# Patient Record
Sex: Female | Born: 1965 | Race: Black or African American | Hispanic: No | Marital: Single | Smoking: Never smoker
Health system: Southern US, Community
[De-identification: ages and names within clinical notes are randomized; demographics above are authoritative.]

## PROBLEM LIST (undated history)

## (undated) DIAGNOSIS — I10 Essential (primary) hypertension: Secondary | ICD-10-CM

## (undated) DIAGNOSIS — I8390 Asymptomatic varicose veins of unspecified lower extremity: Secondary | ICD-10-CM

## (undated) DIAGNOSIS — M9261 Juvenile osteochondrosis of tarsus, right ankle: Secondary | ICD-10-CM

## (undated) DIAGNOSIS — E119 Type 2 diabetes mellitus without complications: Secondary | ICD-10-CM

## (undated) HISTORY — DX: Juvenile osteochondrosis of tarsus, right ankle: M92.61

## (undated) HISTORY — DX: Type 2 diabetes mellitus without complications: E11.9

## (undated) HISTORY — DX: Asymptomatic varicose veins of unspecified lower extremity: I83.90

## (undated) HISTORY — DX: Essential (primary) hypertension: I10

---

## 1998-05-30 ENCOUNTER — Emergency Department (HOSPITAL_COMMUNITY): Admission: EM | Admit: 1998-05-30 | Discharge: 1998-05-30 | Payer: Self-pay | Admitting: Emergency Medicine

## 2003-06-07 ENCOUNTER — Other Ambulatory Visit: Admission: RE | Admit: 2003-06-07 | Discharge: 2003-06-07 | Payer: Self-pay | Admitting: Internal Medicine

## 2004-05-26 ENCOUNTER — Emergency Department (HOSPITAL_COMMUNITY): Admission: EM | Admit: 2004-05-26 | Discharge: 2004-05-27 | Payer: Self-pay | Admitting: Family Medicine

## 2004-05-29 ENCOUNTER — Ambulatory Visit: Payer: Self-pay | Admitting: Internal Medicine

## 2004-06-04 ENCOUNTER — Encounter: Admission: RE | Admit: 2004-06-04 | Discharge: 2004-06-04 | Payer: Self-pay | Admitting: Internal Medicine

## 2004-06-07 ENCOUNTER — Ambulatory Visit: Payer: Self-pay | Admitting: Internal Medicine

## 2004-06-07 ENCOUNTER — Other Ambulatory Visit: Admission: RE | Admit: 2004-06-07 | Discharge: 2004-06-07 | Payer: Self-pay | Admitting: Internal Medicine

## 2004-06-21 ENCOUNTER — Emergency Department (HOSPITAL_COMMUNITY): Admission: EM | Admit: 2004-06-21 | Discharge: 2004-06-21 | Payer: Self-pay | Admitting: Emergency Medicine

## 2004-07-14 ENCOUNTER — Emergency Department (HOSPITAL_COMMUNITY): Admission: EM | Admit: 2004-07-14 | Discharge: 2004-07-14 | Payer: Self-pay | Admitting: Emergency Medicine

## 2004-07-19 ENCOUNTER — Ambulatory Visit: Payer: Self-pay | Admitting: Internal Medicine

## 2004-07-20 ENCOUNTER — Ambulatory Visit (HOSPITAL_COMMUNITY): Admission: RE | Admit: 2004-07-20 | Discharge: 2004-07-20 | Payer: Self-pay | Admitting: Internal Medicine

## 2004-08-02 ENCOUNTER — Ambulatory Visit: Payer: Self-pay | Admitting: Gastroenterology

## 2004-08-06 ENCOUNTER — Ambulatory Visit: Payer: Self-pay | Admitting: Gastroenterology

## 2004-08-24 ENCOUNTER — Ambulatory Visit: Payer: Self-pay | Admitting: Internal Medicine

## 2004-10-09 ENCOUNTER — Ambulatory Visit: Payer: Self-pay | Admitting: Internal Medicine

## 2005-08-27 ENCOUNTER — Ambulatory Visit: Payer: Self-pay | Admitting: Internal Medicine

## 2005-09-02 ENCOUNTER — Ambulatory Visit: Payer: Self-pay | Admitting: Internal Medicine

## 2006-06-16 ENCOUNTER — Ambulatory Visit: Payer: Self-pay | Admitting: Internal Medicine

## 2006-06-20 ENCOUNTER — Ambulatory Visit: Payer: Self-pay | Admitting: Internal Medicine

## 2006-06-20 LAB — CONVERTED CEMR LAB: Hepatitis B-Post: >1000 milliintl units/mL

## 2006-11-01 ENCOUNTER — Emergency Department (HOSPITAL_COMMUNITY): Admission: EM | Admit: 2006-11-01 | Discharge: 2006-11-01 | Payer: Self-pay | Admitting: Emergency Medicine

## 2006-12-02 ENCOUNTER — Telehealth: Payer: Self-pay | Admitting: Internal Medicine

## 2007-05-06 ENCOUNTER — Ambulatory Visit: Payer: Self-pay | Admitting: *Deleted

## 2007-05-21 ENCOUNTER — Encounter (INDEPENDENT_AMBULATORY_CARE_PROVIDER_SITE_OTHER): Payer: Self-pay | Admitting: Nurse Practitioner

## 2007-05-21 ENCOUNTER — Ambulatory Visit: Payer: Self-pay | Admitting: Family Medicine

## 2007-05-21 LAB — CONVERTED CEMR LAB
AST: 18 units/L (ref 0–37)
Alkaline Phosphatase: 90 units/L (ref 39–117)
CO2: 29 meq/L (ref 19–32)
Eosinophils Relative: 1 % (ref 0–5)
Glucose, Bld: 89 mg/dL (ref 70–99)
HDL: 58 mg/dL (ref >39)
Hemoglobin: 12 g/dL (ref 12.0–15.0)
Lymphs Abs: 3.2 10*3/uL (ref 0.7–4.0)
MCV: 69.3 fL — ABNORMAL LOW (ref 78.0–100.0)
Monocytes Absolute: 0.4 10*3/uL (ref 0.1–1.0)
Neutro Abs: 3.2 10*3/uL (ref 1.7–7.7)
Neutrophils Relative %: 47 % (ref 43–77)
Platelets: 354 10*3/uL (ref 150–400)
RDW: 16.7 % — ABNORMAL HIGH (ref 11.5–15.5)
TSH: 1.727 microintl units/mL (ref 0.350–5.50)
Total CHOL/HDL Ratio: 3.1

## 2007-09-23 ENCOUNTER — Telehealth: Payer: Self-pay | Admitting: *Deleted

## 2007-10-12 ENCOUNTER — Ambulatory Visit: Payer: Self-pay | Admitting: Internal Medicine

## 2007-10-12 DIAGNOSIS — K219 Gastro-esophageal reflux disease without esophagitis: Secondary | ICD-10-CM

## 2007-10-12 DIAGNOSIS — I10 Essential (primary) hypertension: Secondary | ICD-10-CM | POA: Insufficient documentation

## 2007-10-12 DIAGNOSIS — D649 Anemia, unspecified: Secondary | ICD-10-CM

## 2007-10-12 DIAGNOSIS — M7660 Achilles tendinitis, unspecified leg: Secondary | ICD-10-CM

## 2007-11-03 ENCOUNTER — Ambulatory Visit: Payer: Self-pay | Admitting: Internal Medicine

## 2007-11-03 LAB — CONVERTED CEMR LAB
ALT: 15 units/L (ref 0–35)
AST: 21 units/L (ref 0–37)
Albumin: 3.1 g/dL — ABNORMAL LOW (ref 3.5–5.2)
Basophils Absolute: 0 10*3/uL (ref 0.0–0.1)
Basophils Relative: 0.6 % (ref 0.0–3.0)
Bilirubin, Direct: 0.1 mg/dL (ref 0.0–0.3)
Chloride: 93 meq/L — ABNORMAL LOW (ref 96–112)
Cholesterol: 159 mg/dL (ref 0–200)
Eosinophils Absolute: 0.1 10*3/uL (ref 0.0–0.7)
GFR calc Af Amer: 142 mL/min
Hemoglobin: 11.4 g/dL — ABNORMAL LOW (ref 12.0–15.0)
Ketones, urine, test strip: NEGATIVE
LDL Cholesterol: 105 mg/dL — ABNORMAL HIGH (ref 0–99)
Lymphocytes Relative: 42.7 % (ref 12.0–46.0)
Neutrophils Relative %: 47.7 % (ref 43.0–77.0)
Platelets: 345 10*3/uL (ref 150–400)
Potassium: 3 meq/L — ABNORMAL LOW (ref 3.5–5.1)
Specific Gravity, Urine: 1.02
TSH: 2.62 microintl units/mL (ref 0.35–5.50)
Total CHOL/HDL Ratio: 3.6
Urobilinogen, UA: 0.2
pH: 6

## 2007-11-10 ENCOUNTER — Encounter: Payer: Self-pay | Admitting: Internal Medicine

## 2007-11-10 ENCOUNTER — Ambulatory Visit: Payer: Self-pay | Admitting: Internal Medicine

## 2007-11-10 ENCOUNTER — Other Ambulatory Visit: Admission: RE | Admit: 2007-11-10 | Discharge: 2007-11-10 | Payer: Self-pay | Admitting: Internal Medicine

## 2007-11-10 DIAGNOSIS — E876 Hypokalemia: Secondary | ICD-10-CM

## 2007-11-12 ENCOUNTER — Telehealth: Payer: Self-pay | Admitting: Internal Medicine

## 2009-05-06 ENCOUNTER — Emergency Department (HOSPITAL_COMMUNITY): Admission: EM | Admit: 2009-05-06 | Discharge: 2009-05-06 | Payer: Self-pay | Admitting: Emergency Medicine

## 2009-05-19 ENCOUNTER — Ambulatory Visit: Payer: Self-pay | Admitting: Internal Medicine

## 2009-05-19 DIAGNOSIS — N83209 Unspecified ovarian cyst, unspecified side: Secondary | ICD-10-CM | POA: Insufficient documentation

## 2009-05-19 DIAGNOSIS — R109 Unspecified abdominal pain: Secondary | ICD-10-CM | POA: Insufficient documentation

## 2009-05-19 DIAGNOSIS — R030 Elevated blood-pressure reading, without diagnosis of hypertension: Secondary | ICD-10-CM | POA: Insufficient documentation

## 2009-06-09 ENCOUNTER — Ambulatory Visit: Payer: Self-pay | Admitting: Internal Medicine

## 2009-06-09 LAB — CONVERTED CEMR LAB
Creatinine, Ser: 0.7 mg/dL (ref 0.4–1.2)
Eosinophils Absolute: 0.2 10*3/uL (ref 0.0–0.7)
Glucose, Bld: 100 mg/dL — ABNORMAL HIGH (ref 70–99)
HCT: 33.9 % — ABNORMAL LOW (ref 36.0–46.0)
Lymphocytes Relative: 33.6 % (ref 12.0–46.0)
Lymphs Abs: 3 10*3/uL (ref 0.7–4.0)
MCHC: 31.6 g/dL (ref 30.0–36.0)
Monocytes Absolute: 0.6 10*3/uL (ref 0.1–1.0)
Neutro Abs: 5.1 10*3/uL (ref 1.4–7.7)
Neutrophils Relative %: 57.7 % (ref 43.0–77.0)
Platelets: 305 10*3/uL (ref 150.0–400.0)
Potassium: 4.3 meq/L (ref 3.5–5.1)
RDW: 16.1 % — ABNORMAL HIGH (ref 11.5–14.6)

## 2009-06-16 ENCOUNTER — Ambulatory Visit: Payer: Self-pay | Admitting: Internal Medicine

## 2009-06-16 DIAGNOSIS — F4321 Adjustment disorder with depressed mood: Secondary | ICD-10-CM | POA: Insufficient documentation

## 2009-06-16 DIAGNOSIS — N920 Excessive and frequent menstruation with regular cycle: Secondary | ICD-10-CM

## 2009-07-14 ENCOUNTER — Ambulatory Visit: Payer: Self-pay | Admitting: Internal Medicine

## 2009-07-14 DIAGNOSIS — M79609 Pain in unspecified limb: Secondary | ICD-10-CM

## 2010-05-15 NOTE — Assessment & Plan Note (Signed)
Summary: EVAL OF ABD PAIN (OVARIAN CYST CONCERNS?) // RS   Vital Signs:  Patient profile:   45 year old female Menstrual status:  regular LMP:     05/04/2009 Height:      67 inches Weight:      289 pounds BMI:     45.43 Temp:     98.6 degrees F oral Pulse rate:   72 / minute BP sitting:   130 / 70  (right arm) Cuff size:   large  Vitals Entered By: Romualdo Bolk, CMA (AAMA) (May 19, 2009 4:09 PM) CC: Left Ovarian Cyst that burst and was seen at [facility] ED on 1/22. Pt is still having severe abd. pain for this. Pt was given hydrocodone and motrin for this. But didn't help the pain. LMP (date): 05/04/2009     Menstrual Status regular Enter LMP: 05/04/2009   History of Present Illness: Ashley Reynolds comesin for  acute appt  today for above.     Had moved to [place] and now back in the area recently and comes back to  reestablish with  the practice.   Last OV here was 7 2009 before she moved.  Over the last  1-2 years she has been generally well and on her HT meds  but rand out of all meds since  NOV.  Was  seen in ED    May 06 2009  when she awoke for sleep because of sudden onset of  very severe pain   left side radiating ot lower abdomen Pain was 10/10 and .   had vomiting.  No fever.   Thought it was a pulled muscle but no injury and  didnt let up and hard to get comfortable so went to ed.    Dx ?  ovarian cyst.     ruptured and rx with vicodin  and  ibuprofen.  had an essentially neg ct of abd pelvis and labs except  potassium was 3. 1  hand hg was in the 10 range . Since that time the pain persists although not a 10/10 . uncomfortable  . sharp pains left mid back and around to lower abdomen  not on right .   no rash  weakness or leg signs .   Hurts certain ways  and after  standing a long period of time.     No fever wieght loss GI or GU dysfunction or association.     Preventive Screening-Counseling & Management  Alcohol-Tobacco     Alcohol drinks/day: 0  Smoking Status: never  Caffeine-Diet-Exercise     Caffeine use/day: 2      Does Patient Exercise: no  Current Medications (verified): 1)  Enalapril-Hydrochlorothiazide 10-25 Mg Tabs (Enalapril-Hydrochlorothiazide) .Marland Kitchen.. 1 By Mouth Once Daily 2)  Zantac 150 Mg  Tabs (Ranitidine Hcl) .Marland Kitchen.. 1 By Mouth Two Times A Day or As Directed  Allergies (verified): No Known Drug Allergies  Past History:  Past Medical History: GERD Hypertension hx of low albumin  hx h pylori rx x 2   urine clear [place] cyst r kidney       Past Surgical History: Denies surgical history    Past History:  Care Management: None Current  Family History: Family History Diabetes 1st degree relative Family History High cholesterol Family History Hypertension Sickle cell disease neice  GF Kidney disease  Family History of Arthritis   Social History: no tobacco  no ets  no pets  hh of 1  sleep  Not much    Single  helping with sick family members    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  Smoking Status:  never Caffeine use/day:  2  Does Patient Exercise:  no  Review of Systems  The patient denies anorexia, fever, vision loss, chest pain, syncope, dyspnea on exertion, peripheral edema, prolonged cough, headaches, hemoptysis, abdominal pain, melena, hematochezia, severe indigestion/heartburn, hematuria, muscle weakness, transient blindness, difficulty walking, abnormal bleeding, enlarged lymph nodes, and angioedema.    Physical Exam  General:  Well-developed,well-nourished,in no mild discomfort  alert,appropriate and cooperative throughout examination Head:  Normocephalic and atraumatic without obvious abnormalities. No apparent alopecia or balding. Eyes:  vision grossly intact, pupils equal, and pupils round.   Ears:  R ear normal and L ear normal.   Neck:  No deformities, masses, or tenderness noted. Lungs:  Normal respiratory effort,  chest expands symmetrically. Lungs are clear to auscultation, no crackles or wheezes.no dullness.   Heart:  Normal rate and regular rhythm. S1 and S2 normal without gallop, murmur, click, rub or other extra sounds. Abdomen:  non-tender, normal bowel sounds, no distention, no masses, no guarding, no rebound tenderness, no abdominal hernia, no inguinal hernia, no hepatomegaly, and no splenomegaly.   left high lumbar  and radiatino around to lateral abdomen tender  . No rash and no masses .  Msk:  no joint swelling and no joint warmth.   Pulses:  no clubbing cyanosis or edema  Extremities:  pulses intact without delay   Neurologic:  gait nl no weakness   neg slr  Skin:  turgor normal, color normal, no ecchymoses, and no petechiae.   Cervical Nodes:  No lymphadenopathy noted Psych:  Oriented X3, good eye contact, not anxious appearing, and not depressed appearing.     Impression & Recommendations:  Problem # 1:  FLANK PAIN, LEFT (ICD-789.09)  dx as mittlesmertx in ed  but all scans and labs unrevealing.   continued pain is atypical for this . location of pain seems renal or MS cause but no cause obvious ..  poss MS and has to bend a lot in job.    consider short course prednisone   incase pain is MS neuritis based.   Her updated medication list for this problem includes:    Ibuprofen 800 Mg Tabs (Ibuprofen) .Marland Kitchen... 1 by mouth q8 hoiurs as needed pain  Orders: Prescription Created Electronically [phone])  Problem # 2:  ABDOMINAL PAIN, LOWER (ICD-789.09) nl exam now   see CT.    consider getting ortho and or gyne to see if not resolving   Her updated medication list for this problem includes:    Ibuprofen 800 Mg Tabs (Ibuprofen) .Marland Kitchen... 1 by mouth q8 hoiurs as needed pain  Problem # 3:  HYPERTENSION (ICD-401.9)  off med for a while   restart and then follow up with lab and reading to ensure control . reestablish care  Her updated medication list for this problem includes:     Enalapril-hydrochlorothiazide 10-25 Mg Tabs (Enalapril-hydrochlorothiazide) .Marland Kitchen... 1 by mouth once daily  Orders: Prescription Created Electronically [phone])  Problem # 4:  MORBID OBESITY (ICD-278.01)  Problem # 5:  ANEMIA (ICD-285.9) needs follow up   assume iron deficiency.  Problem # 6:  HYPOKALEMIA (ICD-276.8) will need repeat  Complete Medication List: 1)  Enalapril-hydrochlorothiazide 10-25 Mg Tabs (Enalapril-hydrochlorothiazide) .Marland Kitchen.. 1 by mouth once daily 2)  Zantac 150 Mg Tabs (Ranitidine  hcl) .... 1 by mouth two times a day or as directed 3)  Prednisone 20 Mg Tabs (Prednisone) .... Take 3 by mouth day 1 then 1 by mouth two times a day for 5 days.  or as directed 4)  Ibuprofen 800 Mg Tabs (Ibuprofen) .Marland Kitchen.. 1 by mouth q8 hoiurs as needed pain  Patient Instructions: 1)  take the prednisone incase this is a pinched nerve. 2)  ibuprofen also ok for pain. 3)  Restart  your BP medication    to get your BP readings down.  4)  need repeat.  bmp and cbcdiff esr in 3 weeks and then OV.  5)  If pain not improving we wil also do a referral .  Prescriptions: ENALAPRIL-HYDROCHLOROTHIAZIDE 10-25 MG TABS (ENALAPRIL-HYDROCHLOROTHIAZIDE) 1 by mouth once daily  #90 x 3   Entered and Authorized by:   Madelin Headings MD   Signed by:   Madelin Headings MD on 05/19/2009   Method used:   [facility]        [facility] [address] [phone]* (retail)       [address]       [place], [place]  [address]       Ph: [phone] or [phone]       Fax: [phone]   RxID:   [id number] IBUPROFEN 800 MG TABS (IBUPROFEN) 1 by mouth q8 hoiurs as needed pain  #60 x 1   Entered and Authorized by:   Madelin Headings MD   Signed by:   Madelin Headings MD on 05/19/2009   Method used:   [facility]ron[facility] to        [facility] [address] [phone]* (retail)       [address]       [place], [place]  [address]       Ph: [phone] or [phone]       Fax:  [phone]   RxID:   [id number] PREDNISONE 20 MG TABS (PREDNISONE) take 3 by mouth day 1 then 1 by mouth two times a day for 5 days.  or as directed  #20 x 0   Entered and Authorized by:   Madelin Headings MD   Signed by:   Madelin Headings MD on 05/19/2009   Method used:   [facility]ron[facility] to        [facility] [address] [phone]* (retail)       [address]       [place], [place]  [address]       Ph: [phone] or [phone]       Fax: [phone]   RxID:   [id number]  ED records obrained and reviewed.

## 2010-05-15 NOTE — Assessment & Plan Note (Signed)
Summary: FU ON LABS/NJR---PT [facility] // RS   Vital Signs:  Patient profile:   45 year old female Menstrual status:  regular LMP:     06/15/2009 Weight:      289 pounds Pulse rate:   60 / minute BP sitting:   130 / 80  (left arm) Cuff size:   large  Vitals Entered By: Romualdo Bolk, CMA (AAMA) (June 16, 2009 2:52 PM) CC: Follow-up visit on labs, Hypertension Management- Discuss going back on ocp's LMP (date): 06/15/2009     Enter LMP: 06/15/2009   History of Present Illness: Ashley Reynolds comesin today for   for follow up of her pain and labs  and acouple of other problems.     1 Pain better after prednisone but still has a numb feeling left flank back when lays on this . no radiating pain now and no weakness . 2 Bleeding.   Has heavy periods   3 Anemia:   always some anemic and runs in family had check from ss and no dx of hemaglobinopathy states 4 Mood  Feeling depressed but not hopeless for months.  no hx of same . family has had lots of med problems.  5 Heavy periods .   Continuing   did much better on ocps in the past .   nose of these.  would like to retry. 6 BP: doing well now   no se of meds   Hypertension History:      She denies headache, chest pain, palpitations, dyspnea with exertion, orthopnea, PND, peripheral edema, visual symptoms, neurologic problems, syncope, and side effects from treatment.  She notes no problems with any antihypertensive medication side effects.        Positive major cardiovascular risk factors include hypertension.  Negative major cardiovascular risk factors include female age less than 4 years old and non-tobacco-user status.     Preventive Screening-Counseling & Management  Alcohol-Tobacco     Alcohol drinks/day: 0     Smoking Status: never  Caffeine-Diet-Exercise     Caffeine use/day: 2      Does Patient Exercise: no  Current Medications (verified): 1)  Enalapril-Hydrochlorothiazide 10-25 Mg Tabs  (Enalapril-Hydrochlorothiazide) .Marland Kitchen.. 1 By Mouth Once Daily 2)  Zantac 150 Mg  Tabs (Ranitidine Hcl) .Marland Kitchen.. 1 By Mouth Two Times A Day or As Directed 3)  Ibuprofen 800 Mg Tabs (Ibuprofen) .Marland Kitchen.. 1 By Mouth Q8 Hoiurs As Needed Pain  Allergies (verified): No Known Drug Allergies  Past History:  Care Management: None Current  Family History: Family History Diabetes 1st degree relative Family History High cholesterol Family History Hypertension Sickle cell disease neice  GF Kidney disease Family History of Arthritis  sis with SS dis   others with trait   Review of Systems       The patient complains of depression.  The patient denies anorexia, fever, weight loss, weight gain, vision loss, decreased hearing, chest pain, syncope, dyspnea on exertion, prolonged cough, abdominal pain, melena, hematochezia, severe indigestion/heartburn, transient blindness, unusual weight change, enlarged lymph nodes, and angioedema.    Physical Exam  General:  alert, well-developed, well-nourished, and well-hydrated.   Head:  normocephalic and atraumatic.   Neck:  No deformities, masses, or tenderness noted. Lungs:  normal respiratory effort, no intercostal retractions, no accessory muscle use, and normal breath sounds.   Heart:  normal rate, regular rhythm, and no murmur.   Abdomen:  soft and non-tender.  flank non tender Pulses:  pulses intact without delay  Extremities:  no clubbing cyanosis or edema  Neurologic:  non focal otherwise  Skin:  turgor normal, color normal, no ecchymoses, no petechiae, and no purpura.   Cervical Nodes:  No lymphadenopathy noted Psych:  Oriented X3, good eye contact, not anxious appearing, and not depressed appearing.     Impression & Recommendations:  Problem # 1:  HYPERTENSION (ICD-401.9) Assessment Improved on meds  Her updated medication list for this problem includes:    Enalapril-hydrochlorothiazide 10-25 Mg Tabs (Enalapril-hydrochlorothiazide) .Marland Kitchen... 1 by  mouth once daily  Problem # 2:  ANEMIA (ICD-285.9) Assessment: Improved  hx of same  some microcytic and inc rdw cw iron deficency although could have nl baselin in the 11 12 range by hx.   disc rx and following  OCPS may help  Hgb: 10.7 (06/09/2009)   Hct: 33.9 (06/09/2009)   Platelets: 305.0 (06/09/2009) RBC: 4.85 (06/09/2009)   RDW: 16.1 (06/09/2009)   WBC: 8.9 (06/09/2009) MCV: 70.0 (06/09/2009)   MCHC: 31.6 (06/09/2009) TSH: 2.62 (11/03/2007)  Problem # 3:  ADJUSTMENT DISORDER WITH DEPRESSED MOOD (ICD-309.0) Assessment: New disc and counseled  consider counseling and ho given   esp with extrenal factores involved .    samples of lexapro given and will follow.   Problem # 4:  EXCESSIVE OR FREQUENT MENSTRUATION (ICD-626.2) Assessment: Deteriorated nl pattern but hevy   responded to meds prev    but  need to watch BP     disc with patient Her updated medication list for this problem includes:    Aviane 0.1-20 Mg-mcg Tabs (Levonorgestrel-ethinyl estrad) .Marland Kitchen... 1 by mouth  once daily  Problem # 5:  FLANK PAIN, LEFT (ICD-789.09) Assessment: Improved better  / cause  still seemed like neuritis   will follow for now.  Her updated medication list for this problem includes:    Ibuprofen 800 Mg Tabs (Ibuprofen) .Marland Kitchen... 1 by mouth q8 hoiurs as needed pain  Complete Medication List: 1)  Enalapril-hydrochlorothiazide 10-25 Mg Tabs (Enalapril-hydrochlorothiazide) .Marland Kitchen.. 1 by mouth once daily 2)  Zantac 150 Mg Tabs (Ranitidine hcl) .Marland Kitchen.. 1 by mouth two times a day or as directed 3)  Ibuprofen 800 Mg Tabs (Ibuprofen) .Marland Kitchen.. 1 by mouth q8 hoiurs as needed pain 4)  Aviane 0.1-20 Mg-mcg Tabs (Levonorgestrel-ethinyl estrad) .Marland Kitchen.. 1 by mouth  once daily 5)  Lexapro 10 Mg Tabs (Escitalopram oxalate) .... Take 1/2 by mouth once daily for a week then 1 by mouth once daily  Hypertension Assessment/Plan:      The patient's hypertensive risk group is category A: No risk factors and no target organ damage.  Her  calculated 10 year risk of coronary heart disease is 3 %.  Today's blood pressure is 130/80.  Her blood pressure goal is < 140/90.  Patient Instructions: 1)  can begin ocps     2)  Monitor your blood pressure carefully. 3)  If high  over 150 call  we may need to stop the hormones. 4)  Take iron at least once a day. 5)  Try   lexapro  1/2 by mouth once daily  for one week then increase to 1 by mouth once daily  6)  rov in 1 months or as needed.  Prescriptions: AVIANE 0.1-20 MG-MCG TABS (LEVONORGESTREL-ETHINYL ESTRAD) 1 by mouth  once daily  #1 pack x 2   Entered and Authorized by:   Madelin Headings MD   Signed by:   Madelin Headings MD on 06/16/2009   Method used:   [facility] to  [facility] [address] [phone]* (retail)       [address]       [place], [place]  [address]       Ph: [phone] or [phone]       Fax: [phone]   RxID:   [id number]

## 2010-05-15 NOTE — Assessment & Plan Note (Signed)
Summary: 1 month rov/njr   Vital Signs:  Patient profile:   45 year old female Menstrual status:  regular LMP:     06/14/2009 Weight:      277 pounds Pulse rate:   78 / minute BP sitting:   146 / 84  (left arm) Cuff size:   large  Vitals Entered By: Romualdo Bolk, CMA (AAMA) (July 14, 2009 8:46 AM)  Serial Vital Signs/Assessments:  Time      Position  BP       Pulse  Resp  Temp     By                     138/78                         Madelin Headings MD  Comments: large cuff right sitting By: Madelin Headings MD   CC: follow-up visit, Hypertension Management LMP (date): 06/14/2009     Enter LMP: 06/14/2009   History of Present Illness: Ashley Reynolds  comesin  for follow up of a number of medical issues.  Mood:  on 10 mg  lexapro for about 2 weeks    no se of meds.  Not as depresssed and thinks it has helped her sleep ok. Periods:  On Aviane    this month and period  due next week.   no se of meds  BP readings   high ?  180  - 150 in am before med and then decreases  only takes med once daily has lost some weight. Right heel pain ; off an on for a while   and saw specialist in the remote past and now  painful in the days so taking ibuprofen almost daily .    Using insets and stretching.  Hypertension History:      She denies headache, chest pain, palpitations, dyspnea with exertion, orthopnea, PND, peripheral edema, visual symptoms, neurologic problems, syncope, and side effects from treatment.  She notes no problems with any antihypertensive medication side effects.        Positive major cardiovascular risk factors include hypertension.  Negative major cardiovascular risk factors include female age less than 22 years old and non-tobacco-user status.     Preventive Screening-Counseling & Management  Alcohol-Tobacco     Alcohol drinks/day: 0     Smoking Status: never  Caffeine-Diet-Exercise     Caffeine use/day: 2      Does Patient Exercise: no  Current  Medications (verified): 1)  Enalapril-Hydrochlorothiazide 10-25 Mg Tabs (Enalapril-Hydrochlorothiazide) .Marland Kitchen.. 1 By Mouth Once Daily 2)  Zantac 150 Mg  Tabs (Ranitidine Hcl) .Marland Kitchen.. 1 By Mouth Two Times A Day or As Directed 3)  Ibuprofen 800 Mg Tabs (Ibuprofen) .Marland Kitchen.. 1 By Mouth Q8 Hoiurs As Needed Pain 4)  Aviane 0.1-20 Mg-Mcg Tabs (Levonorgestrel-Ethinyl Estrad) .Marland Kitchen.. 1 By Mouth  Once Daily 5)  Lexapro 10 Mg Tabs (Escitalopram Oxalate) .... Take 1/2 By Mouth Once Daily For A Week Then 1 By Mouth Once Daily  Allergies (verified): No Known Drug Allergies  Past History:  Past medical, surgical, family and social histories (including risk factors) reviewed, and no changes noted (except as noted below).  Past Medical History: Reviewed history from 05/19/2009 and no changes required. GERD Hypertension hx of low albumin  hx h pylori rx x 2   urine clear [place] cyst r kidney       Past Surgical History: Reviewed  history from 05/19/2009 and no changes required. Denies surgical history    Past History:  Care Management: None Current  Family History: Reviewed history from 06/16/2009 and no changes required. Family History Diabetes 1st degree relative Family History High cholesterol Family History Hypertension Sickle cell disease neice  GF Kidney disease Family History of Arthritis  sis with SS dis   others with trait   Social History: Reviewed history from 05/19/2009 and no changes required. 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   Review of Systems  The patient denies anorexia, fever, weight loss, weight gain, vision loss, decreased hearing, chest pain, syncope, abdominal pain, melena, hematochezia, severe indigestion/heartburn, hematuria, incontinence, unusual weight change, abnormal bleeding, enlarged lymph nodes,  and angioedema.         right heel pain difficult  Physical Exam  General:  Well-developed,well-nourished,in no acute distress; alert,appropriate and cooperative throughout examination Head:  normocephalic and atraumatic.   Eyes:  vision grossly intact and pupils equal.   Neck:  No deformities, masses, or tenderness noted. Pulses:  no cap arefill  Neurologic:  grossly non focal  Skin:  turgor normal and color normal.   Cervical Nodes:  No lymphadenopathy noted Psych:  Oriented X3, good eye contact, and not anxious appearing.  more animated    Impression & Recommendations:  Problem # 1:  ADJUSTMENT DISORDER WITH DEPRESSED MOOD (ICD-309.0) Assessment Improved continue medication for now. samples given.   Problem # 2:  HYPERTENSION (ICD-401.9) ? worse  only taking once daily enalapril  and ocass missess this  high normal today  risk with ocps disc  but think we can controll this . nsaid also can interfere with control  will chnage ot once daily med Her updated medication list for this problem includes:    Enalapril-hydrochlorothiazide 10-25 Mg Tabs (Enalapril-hydrochlorothiazide) .Marland Kitchen... 1 by mouth once daily    Lisinopril-hydrochlorothiazide 20-25 Mg Tabs (Lisinopril-hydrochlorothiazide) .Marland Kitchen... 1 by mouth once daily  Problem # 3:  MORBID OBESITY (ICD-278.01) Assessment: Improved  Problem # 4:  ANEMIA (ICD-285.9) Assessment: Improved continue and hopefully   period control will also help Orders: Hgb (85018) Fingerstick (60454)  Problem # 5:  HEEL PAIN, RIGHT (ICD-729.5)  problematic  and affecting activity and has to take nsaid  . using insetrs etc . ? PF vs Achilles also  rec referral  Orders: Orthopedic Referral (Ortho)  Complete Medication List: 1)  Enalapril-hydrochlorothiazide 10-25 Mg Tabs (Enalapril-hydrochlorothiazide) .Marland Kitchen.. 1 by mouth once daily 2)  Zantac 150 Mg Tabs (Ranitidine hcl) .Marland Kitchen.. 1 by mouth two times a day or as directed 3)  Ibuprofen 800 Mg Tabs  (Ibuprofen) .Marland Kitchen.. 1 by mouth q8 hoiurs as needed pain 4)  Aviane 0.1-20 Mg-mcg Tabs (Levonorgestrel-ethinyl estrad) .Marland Kitchen.. 1 by mouth  once daily 5)  Lexapro 10 Mg Tabs (Escitalopram oxalate) .... Take 1/2 by mouth once daily for a week then 1 by mouth once daily 6)  Lisinopril-hydrochlorothiazide 20-25 Mg Tabs (Lisinopril-hydrochlorothiazide) .Marland Kitchen.. 1 by mouth once daily  Hypertension Assessment/Plan:      The patient's hypertensive risk group is category A: No risk factors and no target organ damage.  Her calculated 10 year risk of coronary heart disease is 5 %.  Today's blood pressure is 146/84.  Her blood pressure goal  is < 140/90.  Patient Instructions: 1)  will do  referral for your heel.  2)  Stretch and ice minimize  ibuprofen  as can interfere with BP meds but ok to use otherwise. 3)  Continue the lexapro   and iron 4)  Continue to monitor  your Bp . Call if 160 or over on a reg basis  5)  Change BP to a once a day formulation . 6)  Please schedule a follow-up appointment in 1 month.  Prescriptions: LISINOPRIL-HYDROCHLOROTHIAZIDE 20-25 MG TABS (LISINOPRIL-HYDROCHLOROTHIAZIDE) 1 by mouth once daily  #90 x 3   Entered and Authorized by:   Madelin Headings MD   Signed by:   Madelin Headings MD on 07/14/2009   Method used:   [facility] to        [facility] [address] [phone]* (retail)       [address]       [place], [place]  [address]       Ph: [phone] or [phone]       Fax: [phone]   RxID:   [id number]   Laboratory Results   Blood Tests   Date/Time Recieved: July 14, 2009 9:18 AM  Date/Time Reported: July 14, 2009 9:18 AM    CBC HGB:  11.4 g/dL   (Normal Range: 36.6-44.0 in Males, 12.0-15.0 in Females) Comments: Wynona Canes, CMA  July 14, 2009 9:18 AM

## 2010-07-01 LAB — URINALYSIS, ROUTINE W REFLEX MICROSCOPIC
Bilirubin Urine: NEGATIVE
Glucose, UA: NEGATIVE mg/dL
Hgb urine dipstick: NEGATIVE
Ketones, ur: NEGATIVE mg/dL
Specific Gravity, Urine: 1.026 (ref 1.005–1.030)
Urobilinogen, UA: 0.2 mg/dL (ref 0.0–1.0)
pH: 5.5 (ref 5.0–8.0)

## 2010-07-01 LAB — CBC
Hemoglobin: 10.8 g/dL — ABNORMAL LOW (ref 12.0–15.0)
MCHC: 31.3 g/dL (ref 30.0–36.0)
MCV: 67.6 fL — ABNORMAL LOW (ref 78.0–100.0)
Platelets: 335 10*3/uL (ref 150–400)
RDW: 17 % — ABNORMAL HIGH (ref 11.5–15.5)
WBC: 8.8 10*3/uL (ref 4.0–10.5)

## 2010-07-01 LAB — DIFFERENTIAL
Monocytes Absolute: 0.4 10*3/uL (ref 0.1–1.0)
Neutro Abs: 6.4 10*3/uL (ref 1.7–7.7)

## 2010-07-01 LAB — BASIC METABOLIC PANEL
CO2: 28 mEq/L (ref 19–32)
Calcium: 8.6 mg/dL (ref 8.4–10.5)
Chloride: 101 mEq/L (ref 96–112)
GFR calc non Af Amer: >60 mL/min (ref >60)
Glucose, Bld: 161 mg/dL — ABNORMAL HIGH (ref 70–99)
Sodium: 136 mEq/L (ref 135–145)

## 2011-11-25 IMAGING — CT CT ABD-PELV W/O CM
2 of 4 series · 17 of 46 positions shown, 19 images · non-contrast
Comparison: 07/14/2004

CLINICAL DATA: Abdominal pain, left flank pain, nausea and vomiting

CT ABDOMEN AND PELVIS WITHOUT CONTRAST
TECHNIQUE: Multidetector CT imaging of the abdomen and pelvis was
performed following the standard protocol without intravenous
contrast.

[Series 2: stone_wo 5.0 b40f st · axial · 0.89mm/px · z∈[-476,-56]mm · 14 of 92 slices shown, 16 images]
[im 4/92  soft-tissue]
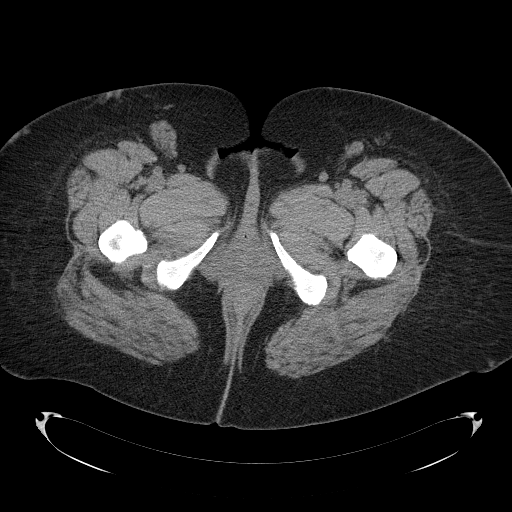
[im 4/92  bone]
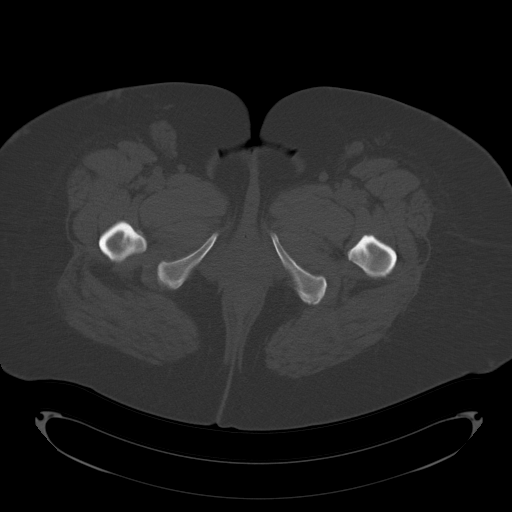
[im 12/92  soft-tissue]
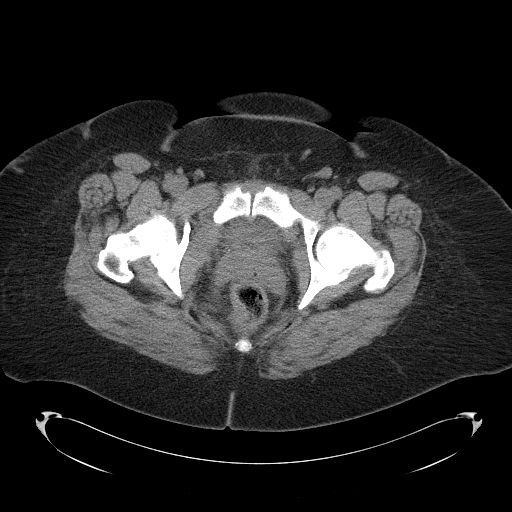
[im 19/92  soft-tissue]
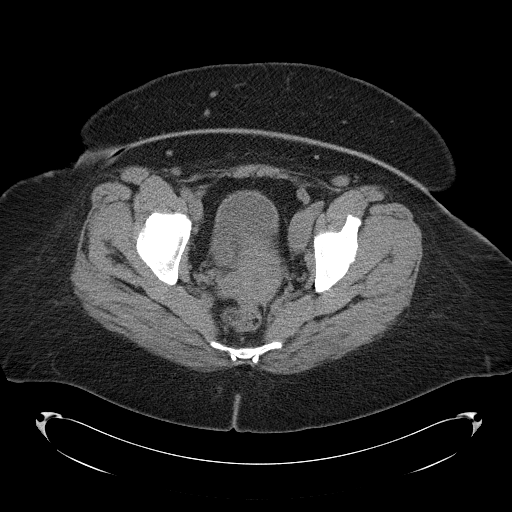
[im 23/92  soft-tissue]
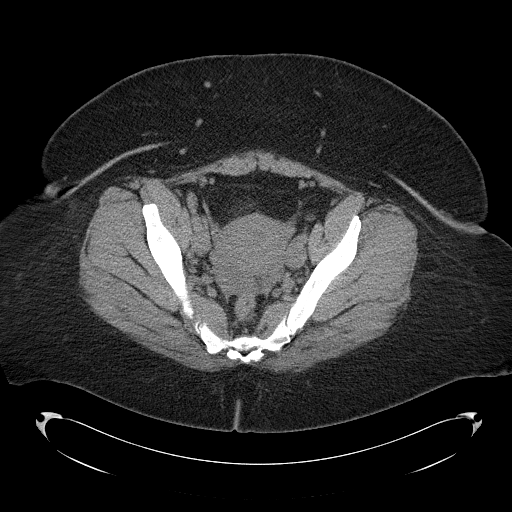
[im 31/92  soft-tissue]
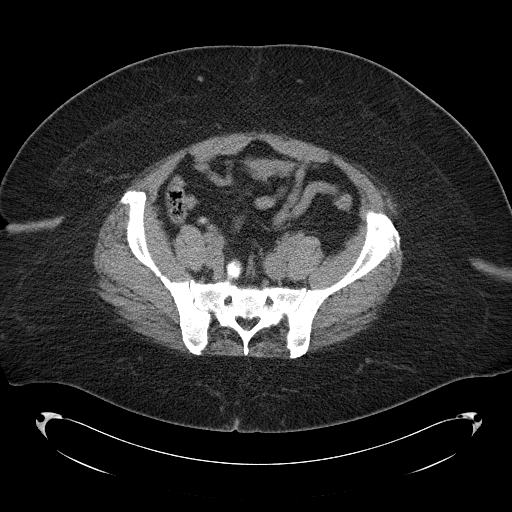
[im 38/92  soft-tissue]
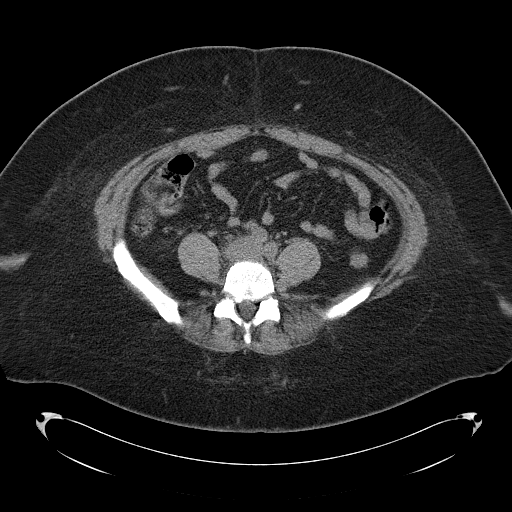
[im 42/92  soft-tissue]
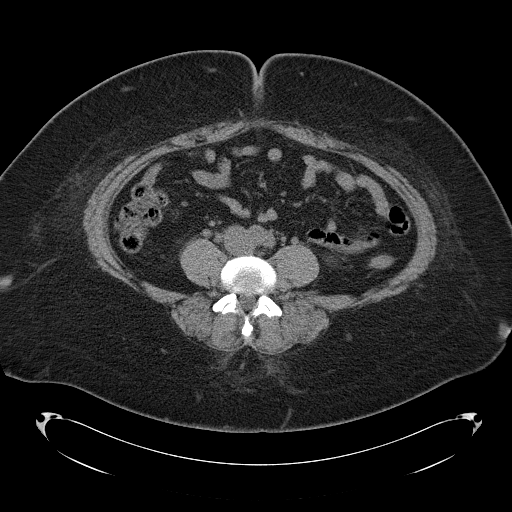
[im 50/92  soft-tissue]
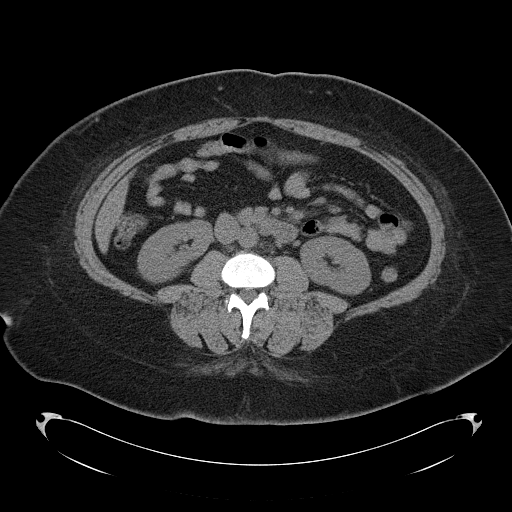
[im 54/92  soft-tissue]
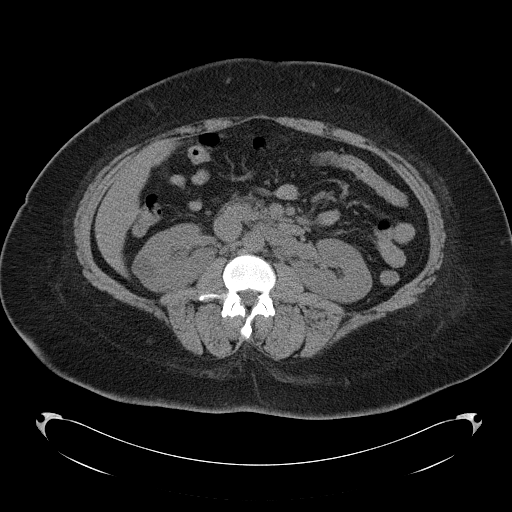
[im 54/92  bone]
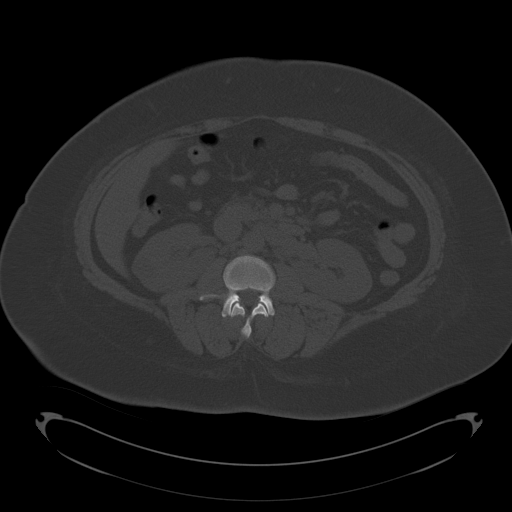
[im 61/92  soft-tissue]
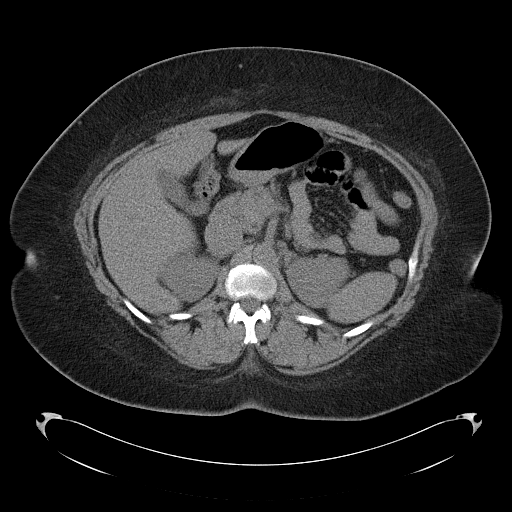
[im 69/92  soft-tissue]
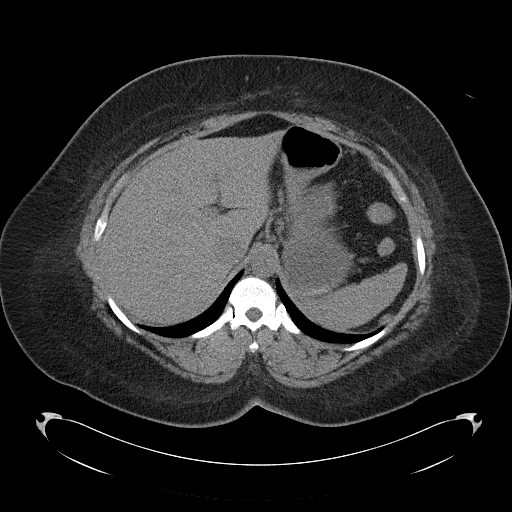
[im 73/92  soft-tissue]
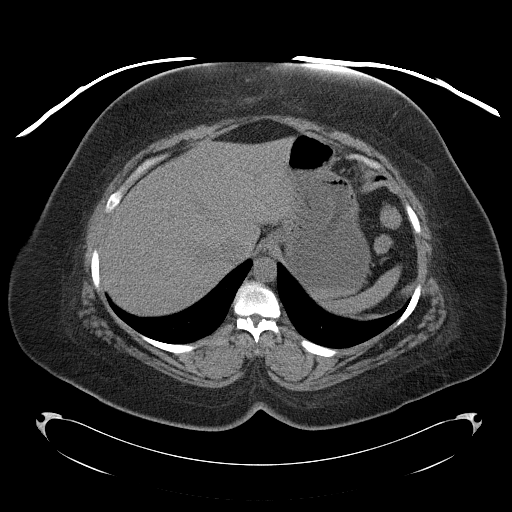
[im 80/92  soft-tissue]
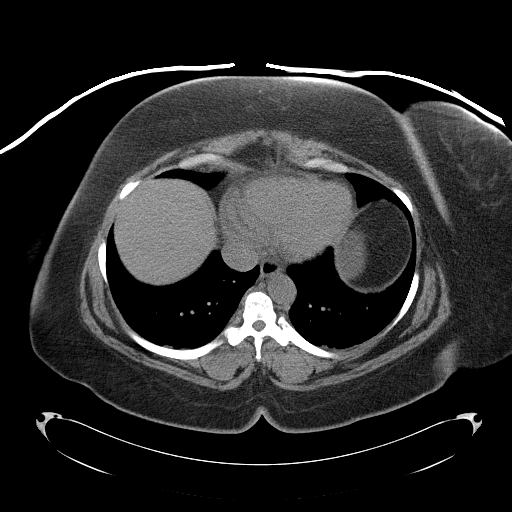
[im 88/92  soft-tissue]
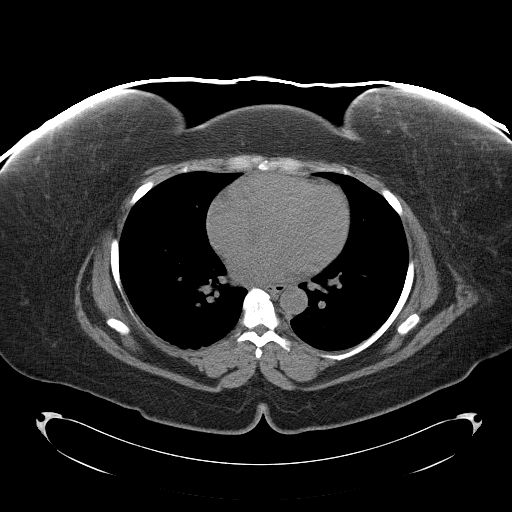

[Series 602: coronal abdomen · coronal · 0.94mm/px · 3 of 140 slices shown]
[im 47/140  soft-tissue]
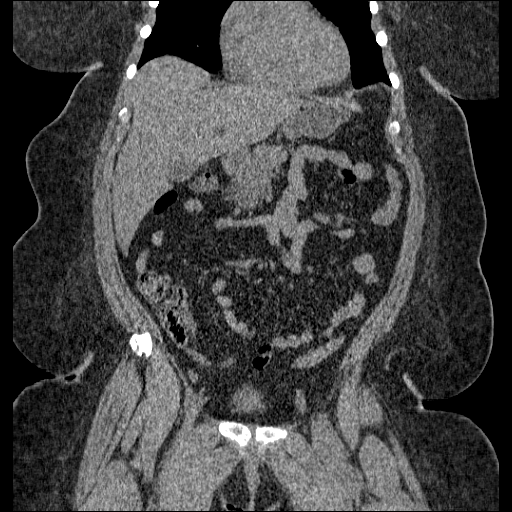
[im 62/140  soft-tissue]
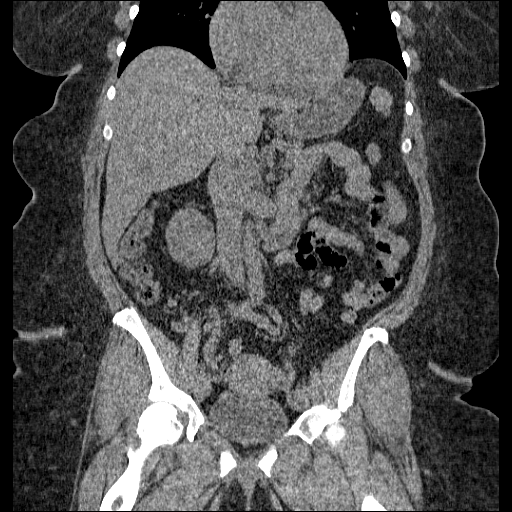
[im 78/140  soft-tissue]
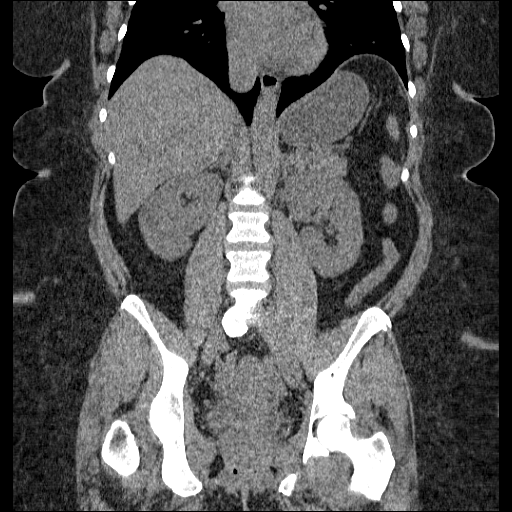

[17 of 46 positions shown; findings below may reference images not displayed]

FINDINGS: Minimal bibasilar atelectasis.  Normal heart size.  No
pericardial or pleural effusion.  No hiatal hernia.

Abdomen:  Kidneys demonstrate no acute obstruction, hydronephrosis
or perinephric inflammation.  No upper urinary tract or intrarenal
calculi demonstrated.  Low density right renal mid pole lesion is
stable consistent with a renal cyst, image 40.

Liver, gallbladder, biliary system, pancreas, spleen, and adrenal
glands are within normal limits for noncontrast exam.  No bowel
obstruction pattern, dilatation, ileus, mesenteric abnormality,
bowel wall thickening, or free air.  Terminal ileum and appendix
are within normal limits in the right lower quadrant.

Pelvis:  Small amount of dependent pelvic fluid, likely
physiologic.  Normal urinary bladder.  No pelvic fluid collection,
hemorrhage, hematoma, inguinal hernia, or acute distal bowel
process.  No pelvic dilated ureter, distal urinary tract calculus,
or UVJ abnormality.

Degenerative changes of the spine and SI joints.
IMPRESSION: No acute obstructing urinary tract calculus
No acute finding in the abdomen or pelvis by noncontrast CT
Stable right renal cyst

## 2013-12-29 ENCOUNTER — Other Ambulatory Visit (HOSPITAL_COMMUNITY): Payer: Self-pay | Admitting: Internal Medicine

## 2013-12-29 ENCOUNTER — Ambulatory Visit (HOSPITAL_COMMUNITY)
Admission: RE | Admit: 2013-12-29 | Discharge: 2013-12-29 | Disposition: A | Discharge status: Home / Self Care | Payer: Managed Care, Other (non HMO) | Source: Ambulatory Visit | Attending: Internal Medicine | Admitting: Internal Medicine

## 2013-12-29 DIAGNOSIS — M7989 Other specified soft tissue disorders: Secondary | ICD-10-CM

## 2013-12-29 DIAGNOSIS — M79609 Pain in unspecified limb: Secondary | ICD-10-CM | POA: Insufficient documentation

## 2013-12-29 NOTE — Progress Notes (Signed)
*  PRELIMINARY RESULTS* Vascular Ultrasound Left lower extremity venous duplex has been completed.  Preliminary findings: No evidence of DVT. Superficial thrombosis is noted in the left greater saphenous vein, from the knee to ankle, and in the lesser saphenous vein, from the origin( junction at popliteal) to the distal calf.  Results called to Dr. Gust Brooms. Relayed message to patient to follow previous instructions given. Let patient leave.   Farrel Demark, RDMS, RVT  12/29/2013, 5:24 PM

## 2014-01-05 ENCOUNTER — Ambulatory Visit: Payer: Self-pay

## 2014-01-05 ENCOUNTER — Ambulatory Visit (INDEPENDENT_AMBULATORY_CARE_PROVIDER_SITE_OTHER): Payer: Commercial Indemnity

## 2014-01-05 DIAGNOSIS — M722 Plantar fascial fibromatosis: Secondary | ICD-10-CM

## 2014-01-20 NOTE — Progress Notes (Signed)
Patient ID: Ashley Reynolds, female   DOB: 1966/02/11, 48 y.o.   MRN: [id number] Patient not seen

## 2014-02-10 ENCOUNTER — Other Ambulatory Visit: Payer: Self-pay | Admitting: *Deleted

## 2014-02-10 ENCOUNTER — Encounter: Payer: Self-pay | Admitting: Vascular Surgery

## 2014-02-10 DIAGNOSIS — I82812 Embolism and thrombosis of superficial veins of left lower extremities: Secondary | ICD-10-CM

## 2014-03-01 ENCOUNTER — Encounter: Payer: Self-pay | Admitting: Vascular Surgery

## 2014-03-02 ENCOUNTER — Encounter: Payer: Self-pay | Admitting: Vascular Surgery

## 2014-03-02 ENCOUNTER — Ambulatory Visit (INDEPENDENT_AMBULATORY_CARE_PROVIDER_SITE_OTHER): Payer: Managed Care, Other (non HMO) | Admitting: Vascular Surgery

## 2014-03-02 ENCOUNTER — Ambulatory Visit (HOSPITAL_COMMUNITY)
Admission: RE | Admit: 2014-03-02 | Discharge: 2014-03-02 | Disposition: A | Discharge status: Home / Self Care | Payer: Managed Care, Other (non HMO) | Source: Ambulatory Visit | Attending: Vascular Surgery | Admitting: Vascular Surgery

## 2014-03-02 VITALS — BP 126/83 | HR 69 | Resp 14 | Ht 64.0 in | Wt 272.0 lb

## 2014-03-02 DIAGNOSIS — I82812 Embolism and thrombosis of superficial veins of left lower extremities: Secondary | ICD-10-CM | POA: Insufficient documentation

## 2014-03-02 DIAGNOSIS — I872 Venous insufficiency (chronic) (peripheral): Secondary | ICD-10-CM

## 2014-03-02 NOTE — Progress Notes (Signed)
[facility] [place] HISTORY AND PHYSICAL   History of Present Illness:  Patient is a 48 y.o. year old female who presents for evaluation of superficial thrombophlebitis left lower extremity. The patient had an episode approximate 6 weeks ago where she had increasing pain and swelling in her left lower extremity. She was diagnosed at that time with a superficial thrombophlebitis. The pain has essentially resolved at this point. She was placed on aspirin. She has had some increased swelling the left leg recently and was placed on Aldactone for this. This has improved the swelling somewhat. She has never worn compression stockings. She has no family history of varicose veins to speak but her father did have a DVT. She also had a sister with sickle cell disease. She denies any prior episodes of thrombophlebitis. She denies any history of DVT. She denies any recent injury to her lower extremities or previous surgical operations on her extremities. 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  Other medical problems include hypertension and diabetes both of which are currently controlled.  She has recently started going to a gym to try to lose some weight.  Past Medical History  Diagnosis Date  . Hypertension   . Diabetes mellitus without complication   . Haglund's deformity of right heel   . Varicose veins     No past surgical history on file.  Social History History  Substance Use Topics  . Smoking status: Never Smoker   . Smokeless tobacco: Never Used  . Alcohol Use: No    Family History Family History  Problem Relation Age of Onset  . Hypertension Mother   . Diabetes Father   . Hypertension Father   . Hypertension Brother   . Diabetes Daughter   . CVA Daughter     Allergies  No Known Allergies   Current Outpatient Prescriptions  Medication Sig Dispense Refill  . amLODipine-valsartan (EXFORGE) 5-160 MG per tablet Take 1 tablet by  mouth daily.    Marland Kitchen. aspirin 325 MG EC tablet Take 325 mg by mouth daily.    Marland Kitchen. levonorgestrel-ethinyl estradiol (AVIANE,ALESSE,LESSINA) 0.1-20 MG-MCG tablet Take 1 tablet by mouth daily.    . metFORMIN (GLUCOPHAGE) 850 MG tablet Take 850 mg by mouth 2 (two) times daily with a meal.    . potassium chloride (K-DUR) 10 MEQ tablet Take 10 mEq by mouth daily.     No current facility-administered medications for this visit.    ROS:   General:  No weight loss, Fever, chills  HEENT: No recent headaches, no nasal bleeding, no visual changes, no sore throat  Neurologic: No dizziness, blackouts, seizures. No recent symptoms of stroke or mini- stroke. No recent episodes of slurred speech, or temporary blindness.  Cardiac: No recent episodes of chest pain/pressure, no shortness of breath at rest.  No shortness of breath with exertion.  Denies history of atrial fibrillation or irregular heartbeat  Vascular: No history of rest pain in feet.  No history of claudication.  No history of non-healing ulcer, No history of DVT   Pulmonary: No home oxygen, no productive cough, no hemoptysis,  No asthma or wheezing  Musculoskeletal:  [ ]  Arthritis, [ ]  Low back pain,  [ ]  Joint pain  Hematologic:No history of hypercoagulable state.  No history of easy bleeding.  No history of anemia  Gastrointestinal: No hematochezia or melena,  No gastroesophageal reflux, no trouble swallowing  Urinary: [ ]  chronic Kidney disease, [ ]  on HD - [ ]   MWF or [ ]  TTHS, [ ]  Burning with urination, [ ]  Frequent urination, [ ]  Difficulty urinating;   Skin: No rashes  Psychological: No history of anxiety,  No history of depression   Physical Examination  Filed Vitals:   03/02/14 1130  BP: 126/83  Pulse: 69  Resp: 14  Height: 5\' 4"  (1.626 m)  Weight: 272 lb (123.378 kg)    Body mass index is 46.67 kg/(m^2).  General:  Alert and oriented, no acute distress HEENT: Normal Neck: No bruit or JVD Pulmonary: Clear to  auscultation bilaterally Cardiac: Regular Rate and Rhythm without murmur Abdomen: Soft, non-tender, non-distended, no mass, obese Skin: No rash, hemosiderin staining left posterior calf, no obvious visible varicosities Extremity Pulses:  2+ radial, brachial, femoral, dorsalis pedis, posterior tibial pulses bilaterally Musculoskeletal: No deformity or edema  Neurologic: Upper and lower extremity motor 5/5 and symmetric  DATA:  Patient had a venous duplex exam today. This showed reflux in the left common femoral vein and popliteal vein. There was no evidence of DVT. She also had diffuse reflux throughout the left greater and lesser saphenous vein. Vein diameter was 5-8 mm of both of these.  There was also some thickening of the vein wall indicative of her recent superficial thrombophlebitis.   ASSESSMENT:  Superficial thrombophlebitis resolving with evidence of superficial reflux in her left lower extremity greater and lesser saphenous vein.   PLAN:  Patient was given a prescription today for bilateral lower extremity compression stockings 20-30 mmHg. She'll return in 3 months for consideration of laser ablation if her symptoms are not resolved or improved with compression alone. She was also counseled today and weight loss. She is going to make a concerted effort to try to lose weight.  Fabienne Brunsharles Fields, MD [facility] [place] Office: [phone] Pager: [phone]

## 2014-06-13 ENCOUNTER — Encounter: Payer: Self-pay | Admitting: Vascular Surgery

## 2014-06-14 ENCOUNTER — Ambulatory Visit: Payer: Managed Care, Other (non HMO) | Admitting: Vascular Surgery
